# Patient Record
Sex: Male | Born: 1980 | Race: Asian | Hispanic: No | Marital: Married | Smoking: Former smoker
Health system: Southern US, Community
[De-identification: ages and names within clinical notes are randomized; demographics above are authoritative.]

## PROBLEM LIST (undated history)

## (undated) HISTORY — PX: NO PAST SURGERIES: SHX2092

---

## 2021-01-31 ENCOUNTER — Other Ambulatory Visit: Payer: Self-pay

## 2021-01-31 ENCOUNTER — Encounter: Payer: Self-pay | Admitting: Cardiology

## 2021-01-31 ENCOUNTER — Ambulatory Visit: Payer: 59 | Admitting: Cardiology

## 2021-01-31 VITALS — BP 110/80 | HR 94 | Ht 72.0 in | Wt 191.0 lb

## 2021-01-31 DIAGNOSIS — K219 Gastro-esophageal reflux disease without esophagitis: Secondary | ICD-10-CM

## 2021-01-31 DIAGNOSIS — E782 Mixed hyperlipidemia: Secondary | ICD-10-CM | POA: Insufficient documentation

## 2021-01-31 DIAGNOSIS — R079 Chest pain, unspecified: Secondary | ICD-10-CM

## 2021-01-31 HISTORY — DX: Chest pain, unspecified: R07.9

## 2021-01-31 HISTORY — DX: Mixed hyperlipidemia: E78.2

## 2021-01-31 HISTORY — DX: Gastro-esophageal reflux disease without esophagitis: K21.9

## 2021-01-31 MED ORDER — NITROGLYCERIN 0.4 MG SL SUBL: 0.4000 mg | SUBLINGUAL_TABLET | SUBLINGUAL | 6 refills | Status: DC | PRN

## 2021-01-31 NOTE — Patient Instructions (Signed)
Medication Instructions:  Your physician has recommended you make the following change in your medication:   Use nitroglycerin 1 tablet placed under the tongue at the first sign of chest pain or an angina attack. 1 tablet may be used every 5 minutes as needed, for up to 15 minutes. Do not take more than 3 tablets in 15 minutes. If pain persist call 911 or go to the nearest ED.    *If you need a refill on your cardiac medications before your next appointment, please call your pharmacy*   Lab Work: Your physician recommends that you return for lab work in: the next few days. You need to have labs done when you are fasting.  You can come Monday through Friday 8:30 am to 12:00 pm and 1:15 to 4:30. You do not need to make an appointment as the order has already been placed. The labs you are going to have done are BMET, CBC, TSH, LFT and Lipids.  If you have labs (blood work) drawn today and your tests are completely normal, you will receive your results only by: MyChart Message (if you have MyChart) OR A paper copy in the mail If you have any lab test that is abnormal or we need to change your treatment, we will call you to review the results.   Testing/Procedures:  Stress Echocardiogram Information Sheet Instructions:  This test will be done at [facility].  Nothing to eat or drink after midnight.  Dress prepared to exercise.  Please bring all current prescription medications with you to the test.  We will order CT coronary calcium score. It will cost $99.00 and is not covered by insurance.  Please call [phone] to schedule.   [facility]  [address]  [place], [place] [address]   Follow-Up: At [facility], you and your health needs are our priority.  As part of our continuing mission to provide you with exceptional heart care, we have created designated Provider Care Teams.  These Care Teams include your primary Cardiologist (physician) and Advanced  Practice Providers (APPs -  Physician Assistants and Nurse Practitioners) who all work together to provide you with the care you need, when you need it.  We recommend signing up for the patient portal called "MyChart".  Sign up information is provided on this After Visit Summary.  MyChart is used to connect with patients for Virtual Visits (Telemedicine).  Patients are able to view lab/test results, encounter notes, upcoming appointments, etc.  Non-urgent messages can be sent to your provider as well.   To learn more about what you can do with MyChart, go to [email or web address].    Your next appointment:   6 month(s)  The format for your next appointment:   In Person  Provider:   Belva Crome, MD   Other Instructions Exercise Stress Echocardiogram An exercise stress echocardiogram is a test to check how well your heart is working. This test uses sound waves (ultrasound) and a computer to make images of your heart before and after exercise. Ultrasound images that are taken before you exercise (resting echocardiogram) will show how much blood is getting to your heart muscle and how well yourheart muscle and heart valves are functioning. During the next part of this test, you will walk on a treadmill or ride a stationary bicycle to see how exercise affects your heart. While you exercise, the electrical activity of your heart will be monitored with anelectrocardiogram (ECG). Your blood pressure will also be  monitored. You may have this test if you have: Chest pain or other symptoms of a heart problem. Recently had a heart attack or heart surgery. Heart valve problems. A condition that causes narrowing of the blood vessels that supply your heart (coronary artery disease). A high risk of heart disease and are starting a new exercise program. A high risk of heart disease and need to have major surgery. Heart arrhythmia (abnormal heart rhythm) problems. Heart failure problems. Tell a health  care provider about: Any allergies you have. All medicines you are taking, including vitamins, herbs, eye drops, creams, and over-the-counter medicines. Any problems you or family members have had with anesthetic medicines. Any surgeries you have had. Any blood disorders you have. Any medical conditions you have. Whether you are pregnant or may be pregnant. What are the risks? Generally, this is a safe test. However, problems may occur, including: Chest pain. Dizziness or light-headedness. Shortness of breath. Increased or irregular heartbeat (palpitations). Nausea or vomiting. Heart attack. This is very rare. What happens before the test? Medicines Ask your health care provider about changing or stopping your regular medicines. This is especially important if you are taking diabetes medicines or blood thinners. If you use an inhaler, bring it with you to the test. General instructions Wear loose, comfortable clothing and walking shoes. Follow instructions from your health care provider about eating or drinking restrictions. You may be asked to avoid all forms of caffeine for 24 hours before your test, or as told by your health care provider. Do not use any products that contain nicotine or tobacco for 4 hours before the test, or as told by your health care provider. These products include cigarettes, chewing tobacco, and vaping devices, such as e-cigarettes. If you need help quitting, ask your health care provider. What happens during the test?  You will take off your clothes from the waist up and put on a hospital gown. Electrodes or electrocardiogram (ECG) patches may be placed on your chest. The electrodes or patches are then connected to a device that monitors your heart rate and rhythm. A blood pressure cuff will be placed on your arm. You will lie down on a table for an ultrasound exam before you exercise. A gel will be applied to your chest to help sound waves pass through your  skin. A handheld device, called a transducer, will be pressed against your chest and moved over your heart. The transducer produces sound waves that travel to your heart and bounce back (or "echo" back) to the transducer. These sound waves will be captured in real-time and changed into images of your heart that can be viewed on a video monitor. The images will be recorded on a computer and reviewed by your health care provider. Once the ultrasound is complete, you will start exercising by walking on a treadmill or pedaling a stationary bicycle. Your blood pressure and heart rhythm will be monitored while you exercise. The exercise will gradually get harder or faster. You will exercise until: Your heart reaches a target level. You are too tired to continue. You cannot continue because of chest pain, weakness, or dizziness. You will have another ultrasound exam immediately after you stop exercising. The procedure may vary among health care providers and hospitals. What can I expect after the test? After your test, it is common to have: Mild soreness. Mild fatigue. Your heart rate and blood pressure will be monitored until they return to yournormal levels. You should not have any new  symptoms after this test. Follow these instructions at home: After your stress test, you should be able to return to your normal activities and diet. Take over-the-counter and prescription medicines only as told by your health care provider. Keep all follow-up visits. This is important. It is up to you to get the results of your test. Ask your health care provider, or the department that is doing the test, when your results will be ready. Contact a health care provider if you: Feel dizzy or light-headed. Have a fast or irregular heartbeat. Have nausea or vomiting. Have a headache. Feel short of breath. Get help right away if you: Develop pain or pressure: In your chest. In your jaw or neck. Between your  shoulder blades. Radiating down your left arm. Faint. Have trouble breathing. These symptoms may represent a serious problem that is an emergency. Do not wait to see if the symptoms will go away. Get medical help right away. Call your local emergency services (911 in the [place] Do not drive yourself to the hospital. Summary An exercise stress echocardiogram is a test that uses ultrasound to check how well your heart works before and after exercise. Before the test, follow instructions from your health care provider about stopping medicines and avoiding caffeine, nicotine and tobacco, and certain foods and drinks. During the test, your blood pressure and heart rhythm will be monitored while you exercise on a treadmill or stationary bicycle. This information is not intended to replace advice given to you by your health care provider. Make sure you discuss any questions you have with your healthcare provider. Document Revised: 12/21/2019 Document Reviewed: 12/21/2019 Elsevier Patient Education  2022 [employer].

## 2021-01-31 NOTE — Progress Notes (Signed)
Cardiology Office Note:    Date:  01/31/2021   ID:  George Mccarthy, DOB [birth year], MRN [id number]  PCP:  Pcp, No  Cardiologist:  Garwin Brothers, MD   Referring MD: No ref. provider found    ASSESSMENT:    1. Chest pain of uncertain etiology   2. Gastroesophageal reflux disease, unspecified whether esophagitis present   3. Mixed dyslipidemia    PLAN:    In order of problems listed above:  Primary prevention stressed with the patient.  Importance of compliance with diet medication stressed any vocalized understanding. Chest pain: Atypical for coronary etiology.  Following recommendations were made.  Sublingual nitroglycerin prescription was sent, its protocol and 911 protocol explained and the patient vocalized understanding questions were answered to the patient's satisfaction.  I also told the patient to resume his proton pump inhibitors over-the-counter on a regular basis to see if he gets relief.  We will do a stress echo to assess the symptoms.  He is agreeable.  He knows to go to the nearest emergency room for any concerning symptoms. Mixed dyslipidemia: He brought old records done several months ago at an outside clinic with blood work.  His TSH was fine and his LDL was markedly elevated at 175.  We will repeat this blood work and advise him accordingly.  He vocalized understanding. Patient will be seen in follow-up appointment in 6 months or earlier if the patient has any concerns    Medication Adjustments/Labs and Tests Ordered: Current medicines are reviewed at length with the patient today.  Concerns regarding medicines are outlined above.  Orders Placed This Encounter  Procedures   CT CARDIAC SCORING   Basic metabolic panel   CBC with Differential/Platelet   Hepatic function panel   Lipid panel   TSH   EKG 12-Lead   ECHOCARDIOGRAM STRESS TEST   Meds ordered this encounter  Medications   nitroGLYCERIN (NITROSTAT) 0.4 MG SL tablet    Sig: Place 1 tablet (0.4 mg  total) under the tongue every 5 (five) minutes as needed.    Dispense:  25 tablet    Refill:  6     History of Present Illness:    George Mccarthy is a 40 y.o. male who is being seen today for the evaluation of chest pain. Patient is a pleasant 40 year old male.  He has past medical history of mixed dyslipidemia.  Patient mentions to me that he has been having episodes of chest tightness and not related to exertion.  He does not exercise on a regular basis no radiation to the neck or to the arms.  He walks around his apartment complex with his son without any discomfort.  No orthopnea or PND.  No radiation of the symptoms to the back.  Patient does not get the symptoms during sexual activity.  At the time of my evaluation, the patient is alert awake oriented and in no distress.  History reviewed. No pertinent past medical history.  Past Surgical History:  Procedure Laterality Date   NO PAST SURGERIES      Current Medications: Current Meds  Medication Sig   nitroGLYCERIN (NITROSTAT) 0.4 MG SL tablet Place 1 tablet (0.4 mg total) under the tongue every 5 (five) minutes as needed.     Allergies:   Patient has no known allergies.   Social History   Socioeconomic History   Marital status: Married    Spouse name: Not on file   Number of children: Not on file   Years  of education: Not on file   Highest education level: Not on file  Occupational History   Not on file  Tobacco Use   Smoking status: Former    Types: Cigarettes   Smokeless tobacco: Never  Substance and Sexual Activity   Alcohol use: Never   Drug use: Never   Sexual activity: Not on file  Other Topics Concern   Not on file  Social History Narrative   Not on file   Social Determinants of Health   Financial Resource Strain: Not on file  Food Insecurity: Not on file  Transportation Needs: Not on file  Physical Activity: Not on file  Stress: Not on file  Social Connections: Not on file     Family History: The  patient's family history includes Congenital heart disease in his sister; Heart disease in his mother.  ROS:   Please see the history of present illness.    All other systems reviewed and are negative.  EKGs/Labs/Other Studies Reviewed:    The following studies were reviewed today: I discussed my findings with the patient at length.  EKG reveals sinus rhythm and nonspecific ST-T changes   Recent Labs: No results found for requested labs within last 8760 hours.  Recent Lipid Panel No results found for: CHOL, TRIG, HDL, CHOLHDL, VLDL, LDLCALC, LDLDIRECT  Physical Exam:    VS:  BP 110/80 (BP Location: Left Arm, Patient Position: Sitting, Cuff Size: Normal)   Pulse 94   Ht 6' (1.829 m)   Wt 191 lb (86.6 kg)   SpO2 97%   BMI 25.90 kg/m     Wt Readings from Last 3 Encounters:  01/31/21 191 lb (86.6 kg)     GEN: Patient is in no acute distress HEENT: Normal NECK: No JVD; No carotid bruits LYMPHATICS: No lymphadenopathy CARDIAC: S1 S2 regular, 2/6 systolic murmur at the apex. RESPIRATORY:  Clear to auscultation without rales, wheezing or rhonchi  ABDOMEN: Soft, non-tender, non-distended MUSCULOSKELETAL:  No edema; No deformity  SKIN: Warm and dry NEUROLOGIC:  Alert and oriented x 3 PSYCHIATRIC:  Normal affect    Signed, Garwin Brothers, MD  01/31/2021 4:47 PM     [facility]

## 2021-02-07 DIAGNOSIS — R079 Chest pain, unspecified: Secondary | ICD-10-CM | POA: Diagnosis not present

## 2021-02-08 ENCOUNTER — Telehealth: Payer: Self-pay

## 2021-02-08 ENCOUNTER — Other Ambulatory Visit: Payer: Self-pay | Admitting: Cardiology

## 2021-02-08 DIAGNOSIS — E782 Mixed hyperlipidemia: Secondary | ICD-10-CM

## 2021-02-08 LAB — LIPID PANEL
Chol/HDL Ratio: 6.2 ratio — ABNORMAL HIGH (ref 0.0–5.0)
Cholesterol, Total: 253 mg/dL — ABNORMAL HIGH (ref 100–199)
HDL: 41 mg/dL (ref >39)
LDL Chol Calc (NIH): 182 mg/dL — ABNORMAL HIGH (ref 0–99)
Triglycerides: 159 mg/dL — ABNORMAL HIGH (ref 0–149)
VLDL Cholesterol Cal: 30 mg/dL (ref 5–40)

## 2021-02-08 LAB — HEPATIC FUNCTION PANEL
ALT: 35 IU/L (ref 0–44)
AST: 26 IU/L (ref 0–40)
Albumin: 4.5 g/dL (ref 4.0–5.0)
Alkaline Phosphatase: 75 IU/L (ref 44–121)
Bilirubin Total: 0.4 mg/dL (ref 0.0–1.2)
Bilirubin, Direct: 0.11 mg/dL (ref 0.00–0.40)
Total Protein: 6.9 g/dL (ref 6.0–8.5)

## 2021-02-08 LAB — CBC WITH DIFFERENTIAL/PLATELET
Basophils Absolute: 0 10*3/uL (ref 0.0–0.2)
Basos: 0 %
EOS (ABSOLUTE): 0.1 10*3/uL (ref 0.0–0.4)
Eos: 1 %
Hematocrit: 45 % (ref 37.5–51.0)
Hemoglobin: 14.8 g/dL (ref 13.0–17.7)
Immature Grans (Abs): 0 10*3/uL (ref 0.0–0.1)
Immature Granulocytes: 0 %
Lymphocytes Absolute: 1.1 10*3/uL (ref 0.7–3.1)
Lymphs: 15 %
MCH: 28.7 pg (ref 26.6–33.0)
MCHC: 32.9 g/dL (ref 31.5–35.7)
MCV: 87 fL (ref 79–97)
Monocytes Absolute: 0.4 10*3/uL (ref 0.1–0.9)
Monocytes: 5 %
Neutrophils Absolute: 6 10*3/uL (ref 1.4–7.0)
Neutrophils: 79 %
Platelets: 189 10*3/uL (ref 150–450)
RBC: 5.16 x10E6/uL (ref 4.14–5.80)
RDW: 13.5 % (ref 11.6–15.4)
WBC: 7.7 10*3/uL (ref 3.4–10.8)

## 2021-02-08 LAB — BASIC METABOLIC PANEL
BUN/Creatinine Ratio: 9 (ref 9–20)
BUN: 8 mg/dL (ref 6–24)
CO2: 22 mmol/L (ref 20–29)
Calcium: 8.9 mg/dL (ref 8.7–10.2)
Chloride: 102 mmol/L (ref 96–106)
Creatinine, Ser: 0.86 mg/dL (ref 0.76–1.27)
Glucose: 101 mg/dL — ABNORMAL HIGH (ref 70–99)
Potassium: 4.1 mmol/L (ref 3.5–5.2)
Sodium: 139 mmol/L (ref 134–144)
eGFR: 112 mL/min/{1.73_m2} (ref >59)

## 2021-02-08 LAB — TSH: TSH: 1.17 u[IU]/mL (ref 0.450–4.500)

## 2021-02-08 MED ORDER — ROSUVASTATIN CALCIUM 10 MG PO TABS: 10.0000 mg | ORAL_TABLET | Freq: Every day | ORAL | 3 refills | Status: DC

## 2021-02-08 NOTE — Telephone Encounter (Signed)
-----   Message from Garwin Brothers, MD sent at 02/08/2021 10:58 AM EDT ----- I spoke to him of diet and dietician. Crestor 10 and ll in 6wks. Cc pcp Garwin Brothers, MD 02/08/2021 10:58 AM

## 2021-02-08 NOTE — Telephone Encounter (Signed)
Spoke with patient regarding results and recommendation.  Patient verbalizes understanding and is agreeable to plan of care. Advised patient to call back with any issues or concerns.  

## 2021-02-09 ENCOUNTER — Telehealth: Payer: Self-pay | Admitting: Cardiology

## 2021-02-09 NOTE — Telephone Encounter (Signed)
George Mccarthy  from CT called in and stated the CT that is sch for Next week on 10/7 Does not have Diagnosis  code on it .  She would like to know if someone could add it on the order or give her a call at:  [phone]

## 2021-02-09 NOTE — Telephone Encounter (Signed)
Spoke with Babs Sciara, she advised that there was no associated dx. Dx has been associated

## 2021-02-16 ENCOUNTER — Other Ambulatory Visit: Payer: Self-pay

## 2021-02-16 ENCOUNTER — Ambulatory Visit (INDEPENDENT_AMBULATORY_CARE_PROVIDER_SITE_OTHER)
Admission: RE | Admit: 2021-02-16 | Discharge: 2021-02-16 | Disposition: A | Discharge status: Home / Self Care | Payer: Self-pay | Source: Ambulatory Visit | Attending: Cardiology | Admitting: Cardiology

## 2021-02-16 DIAGNOSIS — E782 Mixed hyperlipidemia: Secondary | ICD-10-CM

## 2021-02-16 DIAGNOSIS — R079 Chest pain, unspecified: Secondary | ICD-10-CM

## 2021-07-13 ENCOUNTER — Ambulatory Visit: Payer: 59 | Admitting: Cardiology

## 2021-08-21 ENCOUNTER — Other Ambulatory Visit: Payer: Self-pay

## 2021-08-22 ENCOUNTER — Other Ambulatory Visit: Payer: Self-pay

## 2021-08-22 ENCOUNTER — Telehealth: Payer: Self-pay | Admitting: Cardiology

## 2021-08-22 DIAGNOSIS — E782 Mixed hyperlipidemia: Secondary | ICD-10-CM

## 2021-08-22 NOTE — Telephone Encounter (Signed)
New Message: ? ? ? ? ?Patient is at the lab at the [facility] location. He needs an order asap please., ?

## 2021-08-22 NOTE — Telephone Encounter (Signed)
Pt advised that the lab has been released. ?

## 2021-08-23 ENCOUNTER — Ambulatory Visit: Payer: 59 | Admitting: Cardiology

## 2021-08-23 LAB — LIPID PANEL
Chol/HDL Ratio: 6.2 ratio — ABNORMAL HIGH (ref 0.0–5.0)
Cholesterol, Total: 273 mg/dL — ABNORMAL HIGH (ref 100–199)
HDL: 44 mg/dL (ref >39)
LDL Chol Calc (NIH): 176 mg/dL — ABNORMAL HIGH (ref 0–99)
Triglycerides: 278 mg/dL — ABNORMAL HIGH (ref 0–149)
VLDL Cholesterol Cal: 53 mg/dL — ABNORMAL HIGH (ref 5–40)

## 2021-08-23 MED ORDER — ROSUVASTATIN CALCIUM 10 MG PO TABS: 10.0000 mg | ORAL_TABLET | Freq: Every day | ORAL | 3 refills | Status: DC

## 2021-08-24 ENCOUNTER — Ambulatory Visit: Payer: 59 | Admitting: Cardiology

## 2021-10-17 ENCOUNTER — Other Ambulatory Visit: Payer: Self-pay

## 2021-10-17 DIAGNOSIS — E782 Mixed hyperlipidemia: Secondary | ICD-10-CM

## 2021-10-18 LAB — HEPATIC FUNCTION PANEL
ALT: 37 IU/L (ref 0–44)
AST: 24 IU/L (ref 0–40)
Albumin: 4.4 g/dL (ref 4.0–5.0)
Alkaline Phosphatase: 66 IU/L (ref 44–121)
Bilirubin Total: 0.3 mg/dL (ref 0.0–1.2)
Bilirubin, Direct: 0.11 mg/dL (ref 0.00–0.40)
Total Protein: 6.7 g/dL (ref 6.0–8.5)

## 2021-10-18 LAB — LIPID PANEL
Chol/HDL Ratio: 3.2 ratio (ref 0.0–5.0)
Cholesterol, Total: 135 mg/dL (ref 100–199)
HDL: 42 mg/dL (ref >39)
LDL Chol Calc (NIH): 56 mg/dL (ref 0–99)
Triglycerides: 231 mg/dL — ABNORMAL HIGH (ref 0–149)
VLDL Cholesterol Cal: 37 mg/dL (ref 5–40)

## 2021-10-19 ENCOUNTER — Ambulatory Visit: Payer: 59 | Admitting: Cardiology

## 2021-10-19 ENCOUNTER — Encounter: Payer: Self-pay | Admitting: Cardiology

## 2021-10-19 VITALS — BP 108/72 | HR 74 | Ht 72.0 in | Wt 191.8 lb

## 2021-10-19 DIAGNOSIS — R931 Abnormal findings on diagnostic imaging of heart and coronary circulation: Secondary | ICD-10-CM

## 2021-10-19 DIAGNOSIS — E782 Mixed hyperlipidemia: Secondary | ICD-10-CM | POA: Diagnosis not present

## 2021-10-19 DIAGNOSIS — R5383 Other fatigue: Secondary | ICD-10-CM | POA: Diagnosis not present

## 2021-10-19 DIAGNOSIS — R079 Chest pain, unspecified: Secondary | ICD-10-CM | POA: Diagnosis not present

## 2021-10-19 HISTORY — DX: Abnormal findings on diagnostic imaging of heart and coronary circulation: R93.1

## 2021-10-19 NOTE — Patient Instructions (Addendum)
Medication Instructions:  Your physician recommends that you continue on your current medications as directed. Please refer to the Current Medication list given to you today.  *If you need a refill on your cardiac medications before your next appointment, please call your pharmacy*   Lab Work: Your physician recommends that you return for lab work in: 4 months You need to have labs done when you are fasting.  You can come Monday through Friday 8:30 am to 12:00 pm and 1:15 to 4:30. You do not need to make an appointment as the order has already been placed. The labs you are going to have done are BMET, CBC, TSH, LFT and Lipids, Vitamin D and HgbA1C If you have labs (blood work) drawn today and your tests are completely normal, you will receive your results only by: MyChart Message (if you have MyChart) OR A paper copy in the mail If you have any lab test that is abnormal or we need to change your treatment, we will call you to review the results.   Testing/Procedures: Your physician has requested that you have an echocardiogram. Echocardiography is a painless test that uses sound waves to create images of your heart. It provides your doctor with information about the size and shape of your heart and how well your heart's chambers and valves are working. This procedure takes approximately one hour. There are no restrictions for this procedure.   Your physician has requested that you have a renal artery duplex. During this test, an ultrasound is used to evaluate blood flow to the kidneys. Allow one hour for this exam. Do not eat after midnight the day before and avoid carbonated beverages. Take your medications as you usually do.   Follow-Up: At [facility], you and your health needs are our priority.  As part of our continuing mission to provide you with exceptional heart care, we have created designated Provider Care Teams.  These Care Teams include your primary Cardiologist (physician) and  Advanced Practice Providers (APPs -  Physician Assistants and Nurse Practitioners) who all work together to provide you with the care you need, when you need it.  We recommend signing up for the patient portal called "MyChart".  Sign up information is provided on this After Visit Summary.  MyChart is used to connect with patients for Virtual Visits (Telemedicine).  Patients are able to view lab/test results, encounter notes, upcoming appointments, etc.  Non-urgent messages can be sent to your provider as well.   To learn more about what you can do with MyChart, go to [email or web address].    Your next appointment:   12 month(s)  The format for your next appointment:   In Person  Provider:   Belva Crome, MD    Other Instructions NA

## 2021-10-19 NOTE — Progress Notes (Signed)
Cardiology Office Note:    Date:  10/19/2021   ID:  George Mccarthy, DOB 1980/11/09, MRN [id number]  PCP:  Pcp, No  Cardiologist:  Garwin Brothers, MD   Referring MD: No ref. provider found    ASSESSMENT:    1. Chest pain of uncertain etiology   2. Elevated coronary artery calcium score   3. Mixed dyslipidemia    PLAN:    In order of problems listed above:  Mildly elevated calcium score: Secondary prevention stressed with the patient.  Importance of compliance with diet medication stressed any vocalized understanding.  He was advised to walk at least half an hour a day 5 days a week and he promises to do so. Mixed dyslipidemia: On statin therapy.  LDL is at goal.  Triglycerides are still elevated.  Diet emphasized at extensive length and he promises to do better.  He will have complete blood work in 4 months for follow-up.  He will be seen in follow-up appointment on an annual basis.  He had multiple questions which were answered to his satisfaction.   Medication Adjustments/Labs and Tests Ordered: Current medicines are reviewed at length with the patient today.  Concerns regarding medicines are outlined above.  No orders of the defined types were placed in this encounter.  No orders of the defined types were placed in this encounter.    No chief complaint on file.    History of Present Illness:    George Mccarthy is a 41 y.o. male.  Patient has past medical history of elevated calcium score and mixed dyslipidemia.  He denies any problems at this time and takes care of activities of daily living.  No chest pain orthopnea or PND.  At the time of my evaluation, the patient is alert awake oriented and in no distress.  Past Medical History:  Diagnosis Date   Chest pain of uncertain etiology 01/31/2021   GERD (gastroesophageal reflux disease) 01/31/2021   Mixed dyslipidemia 01/31/2021    Past Surgical History:  Procedure Laterality Date   NO PAST SURGERIES      Current  Medications: Current Meds  Medication Sig   nitroGLYCERIN (NITROSTAT) 0.4 MG SL tablet Place 0.4 mg under the tongue every 5 (five) minutes as needed for chest pain.   rosuvastatin (CRESTOR) 10 MG tablet Take 1 tablet (10 mg total) by mouth daily.     Allergies:   Patient has no known allergies.   Social History   Socioeconomic History   Marital status: Married    Spouse name: Not on file   Number of children: Not on file   Years of education: Not on file   Highest education level: Not on file  Occupational History   Not on file  Tobacco Use   Smoking status: Former    Types: Cigarettes    Passive exposure: Past   Smokeless tobacco: Never  Vaping Use   Vaping Use: Never used  Substance and Sexual Activity   Alcohol use: Never   Drug use: Never   Sexual activity: Not on file  Other Topics Concern   Not on file  Social History Narrative   Not on file   Social Determinants of Health   Financial Resource Strain: Not on file  Food Insecurity: Not on file  Transportation Needs: Not on file  Physical Activity: Not on file  Stress: Not on file  Social Connections: Not on file     Family History: The patient's family history includes Congenital heart disease  in his sister; Heart disease in his mother.  ROS:   Please see the history of present illness.    All other systems reviewed and are negative.  EKGs/Labs/Other Studies Reviewed:    The following studies were reviewed today: I discussed my findings with the patient at length   Recent Labs: 02/07/2021: BUN 8; Creatinine, Ser 0.86; Hemoglobin 14.8; Platelets 189; Potassium 4.1; Sodium 139; TSH 1.170 10/17/2021: ALT 37  Recent Lipid Panel    Component Value Date/Time   CHOL 135 10/17/2021 0837   TRIG 231 (H) 10/17/2021 0837   HDL 42 10/17/2021 0837   CHOLHDL 3.2 10/17/2021 0837   LDLCALC 56 10/17/2021 0837    Physical Exam:    VS:  BP 108/72 (BP Location: Left Arm, Patient Position: Sitting)   Pulse 74    Ht 6' (1.829 m)   Wt 191 lb 12.8 oz (87 kg)   SpO2 97%   BMI 26.01 kg/m     Wt Readings from Last 3 Encounters:  10/19/21 191 lb 12.8 oz (87 kg)  01/31/21 191 lb (86.6 kg)     GEN: Patient is in no acute distress HEENT: Normal NECK: No JVD; No carotid bruits LYMPHATICS: No lymphadenopathy CARDIAC: Hear sounds regular, 2/6 systolic murmur at the apex. RESPIRATORY:  Clear to auscultation without rales, wheezing or rhonchi  ABDOMEN: Soft, non-tender, non-distended MUSCULOSKELETAL:  No edema; No deformity  SKIN: Warm and dry NEUROLOGIC:  Alert and oriented x 3 PSYCHIATRIC:  Normal affect   Signed, Garwin Brothers, MD  10/19/2021 10:13 AM    [facility]

## 2022-09-05 IMAGING — CT CT CARDIAC CORONARY ARTERY CALCIUM SCORE
3 series · 14 of 20 positions shown, 16 images · non-contrast
Comparison: None.
COMPARISON: None.

Addendum:
EXAM:
OVER-READ INTERPRETATION  CT CHEST

The following report is an over-read performed by radiologist Dr.
Nana Qwasi Khalifa Jnr [REDACTED] on 02/16/2021. This
over-read does not include interpretation of cardiac or coronary
anatomy or pathology. The coronary calcium score interpretation by
the cardiologist is attached.
CLINICAL DATA: Risk stratification: 39 Year-old [place] Asian male
Coronary Calcium Score
TECHNIQUE: The patient was scanned on a Siemens Force scanner. Axial
non-contrast 3 mm slices were carried out through the heart. The
data set was analyzed on a dedicated work station and scored using
the Agatson method.

[Series 2: cascseq 2.0 sa36 (id) (id) · axial · 0.39mm/px · z∈[-207,-127]mm · 4 of 68 slices shown]
[im 14/68  vessel]
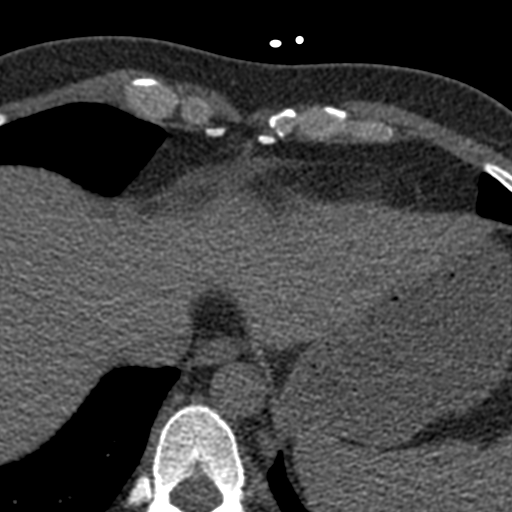
[im 27/68  vessel]
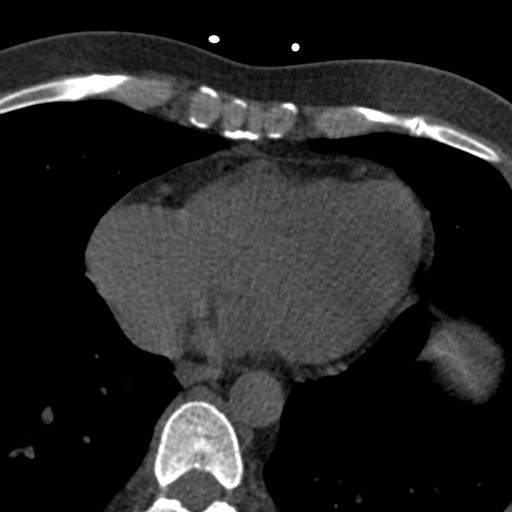
[im 41/68  vessel]
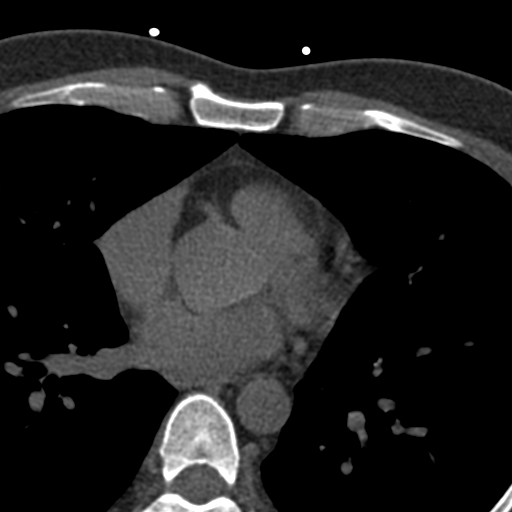
[im 54/68  vessel]
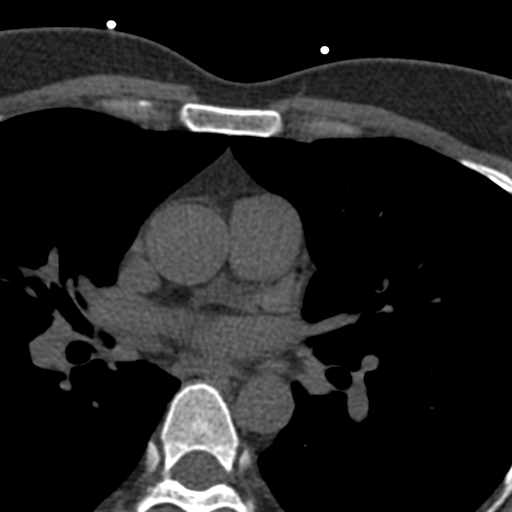

[Series 3: cascseq 2.0 bf37 st · axial · 0.70mm/px · z∈[-211,-123]mm · 5 of 68 slices shown, 7 images]
[im 12/68  vessel]
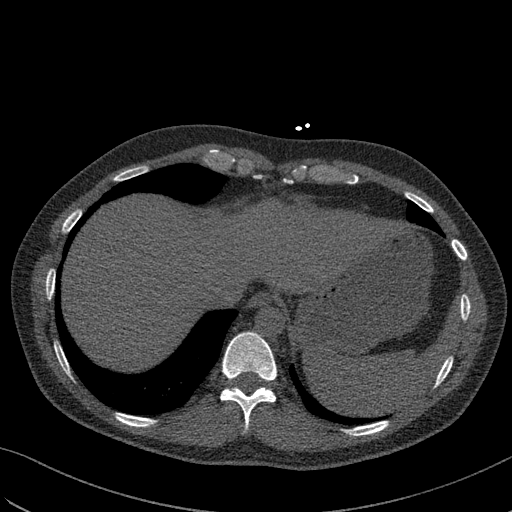
[im 12/68  lung]
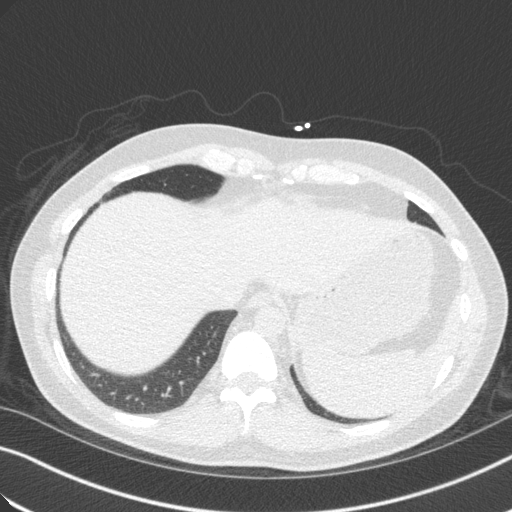
[im 23/68  vessel]
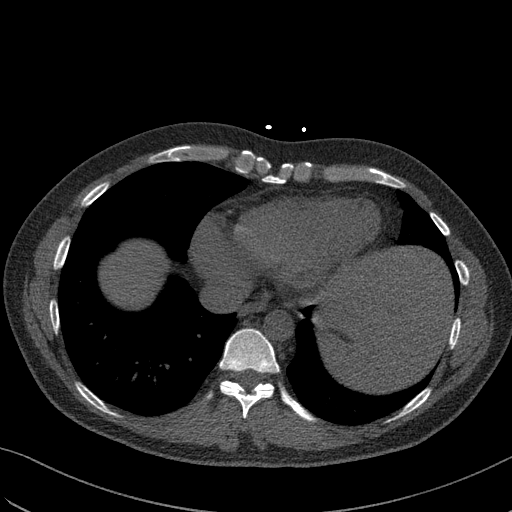
[im 34/68  vessel]
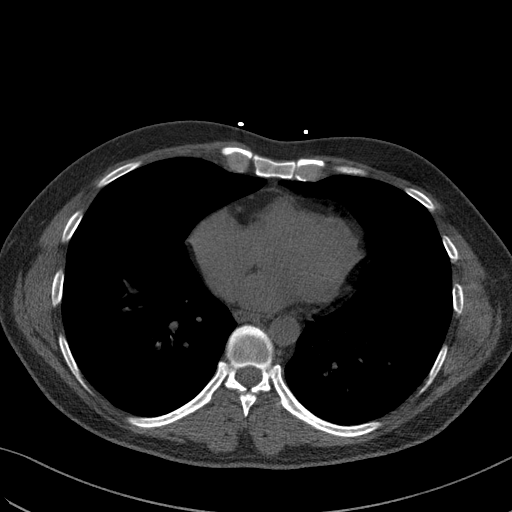
[im 45/68  vessel]
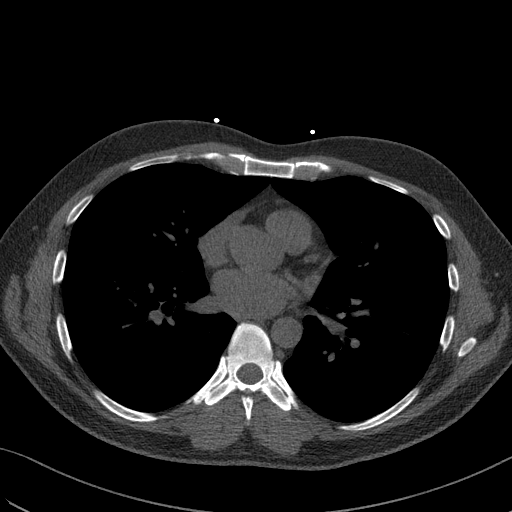
[im 56/68  vessel]
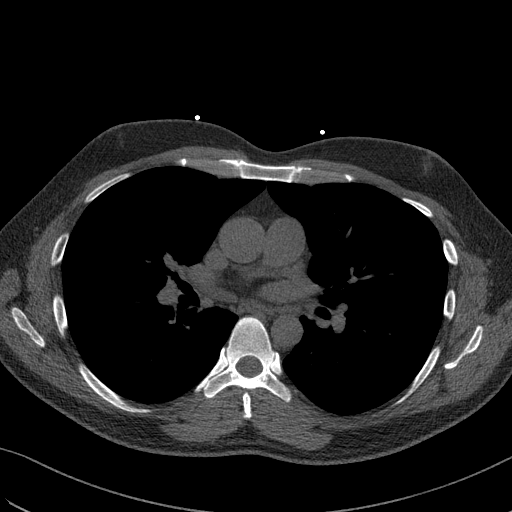
[im 56/68  lung]
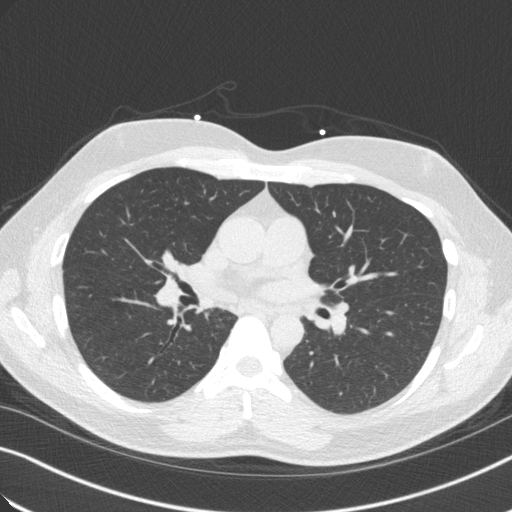

[Series 4: cascseq 2.0 br59 lung · axial · 0.70mm/px · z∈[-211,-123]mm · 5 of 68 slices shown]
[im 12/68  lung]
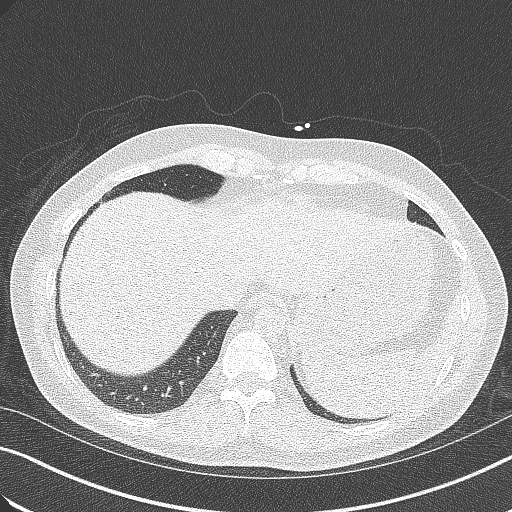
[im 23/68  lung]
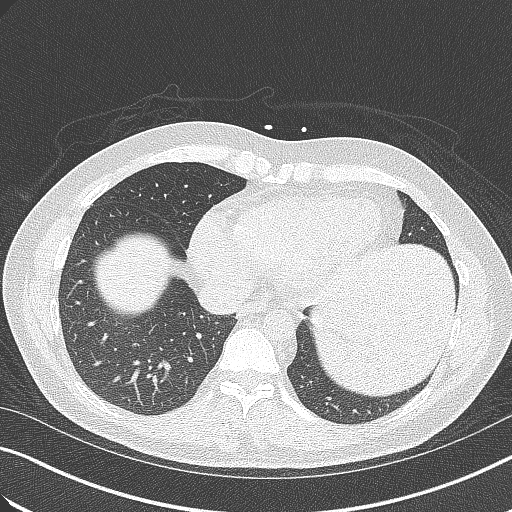
[im 34/68  lung]
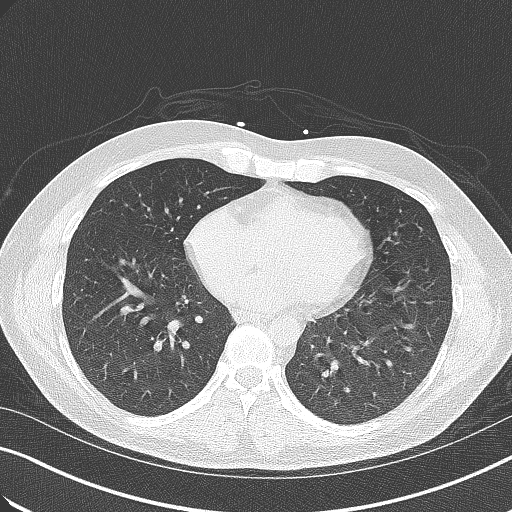
[im 45/68  lung]
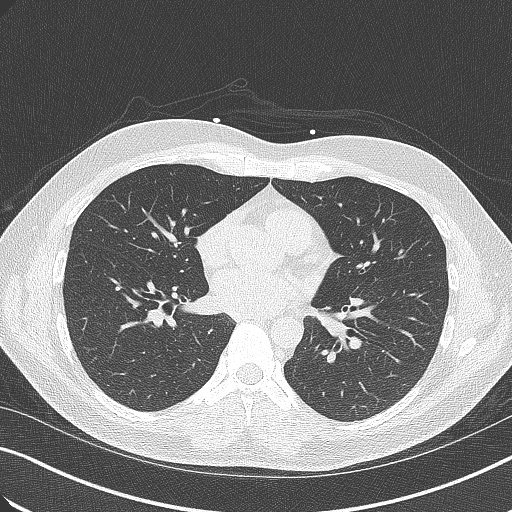
[im 56/68  lung]
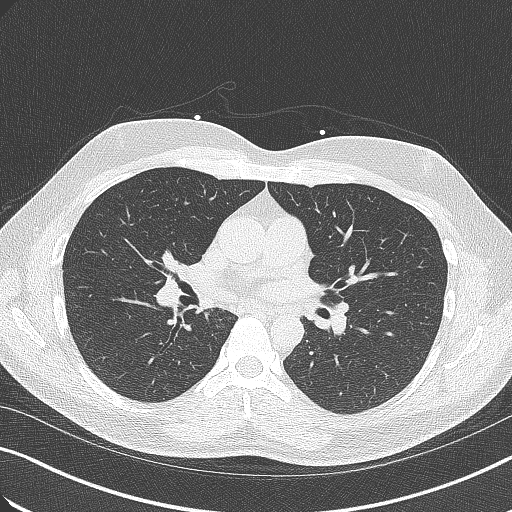

[14 of 20 positions shown; findings below may reference images not displayed]

FINDINGS: Within the visualized portions of the thorax there are no suspicious
appearing pulmonary nodules or masses, there is no acute
consolidative airspace disease, no pleural effusions, no
pneumothorax and no lymphadenopathy. Visualized portions of the
upper abdomen are unremarkable. There are no aggressive appearing
lytic or blastic lesions noted in the visualized portions of the
skeleton.
IMPRESSION: 1. No significant incidental noncardiac findings are noted.
FINDINGS: Non-cardiac: See separate report from [REDACTED].

Ascending Aorta: Normal caliber.

Pericardium: Normal.

Coronary arteries: Normal origins.

Coronary Calcium Score:

Left main: 0

Left anterior descending artery: 0

Left circumflex artery: 0

Right coronary artery: 1

Total: 1
IMPRESSION: 1. Coronary calcium score of 1. Elevated when match to age and
gender.

RECOMMENDATIONS:



If CAC = 0, it is reasonable to withhold statin therapy and reassess
in 5 to 10 years, as long as higher risk conditions are absent
(diabetes mellitus, family history of premature CHD in first degree
relatives (males <55 years; females <65 years), cigarette smoking,
LDL >=190 mg/dL or other independent risk factors).

If CAC is 1 to 99, it is reasonable to initiate statin therapy for
patients >=55 years of age.

If CAC is >=100 or >=75th percentile, it is reasonable to initiate
statin therapy at any age.

Cardiology referral should be considered for patients with CAC
scores =400 or >=75th percentile.

*2250 AHA/ACC/AACVPR/AAPA/ABC/JOSHJAX/LANZA/MARCY/Bhebhe/BRENNAN/POYAZ/BAZONA BERNADIN
Guideline on the Management of Blood Cholesterol: A Report of the
American College of Cardiology/American Heart Association Task Force
on Clinical Practice Guidelines. J Am Coll Cardiol.
8285;73(24):1358-1831.

*** End of Addendum ***
EXAM:
OVER-READ INTERPRETATION  CT CHEST

The following report is an over-read performed by radiologist Dr.
Nana Qwasi Khalifa Jnr [REDACTED] on 02/16/2021. This
over-read does not include interpretation of cardiac or coronary
anatomy or pathology. The coronary calcium score interpretation by
the cardiologist is attached.
FINDINGS: Within the visualized portions of the thorax there are no suspicious
appearing pulmonary nodules or masses, there is no acute
consolidative airspace disease, no pleural effusions, no
pneumothorax and no lymphadenopathy. Visualized portions of the
upper abdomen are unremarkable. There are no aggressive appearing
lytic or blastic lesions noted in the visualized portions of the
skeleton.
IMPRESSION: 1. No significant incidental noncardiac findings are noted.

## 2023-01-28 ENCOUNTER — Other Ambulatory Visit: Payer: Self-pay | Admitting: Cardiology

## 2023-02-04 ENCOUNTER — Telehealth: Payer: Self-pay | Admitting: Cardiology

## 2023-02-04 DIAGNOSIS — R079 Chest pain, unspecified: Secondary | ICD-10-CM

## 2023-02-04 DIAGNOSIS — E782 Mixed hyperlipidemia: Secondary | ICD-10-CM

## 2023-02-04 DIAGNOSIS — Z131 Encounter for screening for diabetes mellitus: Secondary | ICD-10-CM

## 2023-02-04 DIAGNOSIS — R5383 Other fatigue: Secondary | ICD-10-CM

## 2023-02-04 DIAGNOSIS — R931 Abnormal findings on diagnostic imaging of heart and coronary circulation: Secondary | ICD-10-CM

## 2023-02-04 NOTE — Telephone Encounter (Signed)
Patient has an appt on Friday in [place] with [facility]/ would like to know if he can go ahead and have labs done prior to appt  Please call [phone]  Thank you

## 2023-02-05 NOTE — Telephone Encounter (Signed)
Pt aware that the orders have been placed.

## 2023-02-06 LAB — COMPREHENSIVE METABOLIC PANEL
ALT: 37 IU/L (ref 0–44)
AST: 35 IU/L (ref 0–40)
Albumin: 4.5 g/dL (ref 4.1–5.1)
Alkaline Phosphatase: 64 IU/L (ref 44–121)
BUN/Creatinine Ratio: 10 (ref 9–20)
BUN: 7 mg/dL (ref 6–24)
Bilirubin Total: 0.4 mg/dL (ref 0.0–1.2)
CO2: 22 mmol/L (ref 20–29)
Calcium: 9.4 mg/dL (ref 8.7–10.2)
Chloride: 105 mmol/L (ref 96–106)
Creatinine, Ser: 0.73 mg/dL — ABNORMAL LOW (ref 0.76–1.27)
Globulin, Total: 2.5 g/dL (ref 1.5–4.5)
Glucose: 109 mg/dL — ABNORMAL HIGH (ref 70–99)
Potassium: 4.9 mmol/L (ref 3.5–5.2)
Sodium: 143 mmol/L (ref 134–144)
Total Protein: 7 g/dL (ref 6.0–8.5)
eGFR: 116 mL/min/{1.73_m2} (ref >59)

## 2023-02-06 LAB — VITAMIN D 25 HYDROXY (VIT D DEFICIENCY, FRACTURES): Vit D, 25-Hydroxy: 37.4 ng/mL (ref 30.0–100.0)

## 2023-02-06 LAB — CBC
Hematocrit: 46.6 % (ref 37.5–51.0)
Hemoglobin: 14.8 g/dL (ref 13.0–17.7)
MCH: 29.3 pg (ref 26.6–33.0)
MCHC: 31.8 g/dL (ref 31.5–35.7)
MCV: 92 fL (ref 79–97)
Platelets: 116 10*3/uL — ABNORMAL LOW (ref 150–450)
RBC: 5.05 x10E6/uL (ref 4.14–5.80)
RDW: 13.8 % (ref 11.6–15.4)
WBC: 4 10*3/uL (ref 3.4–10.8)

## 2023-02-06 LAB — LIPID PANEL
Chol/HDL Ratio: 3.2 ratio (ref 0.0–5.0)
Cholesterol, Total: 166 mg/dL (ref 100–199)
HDL: 52 mg/dL (ref >39)
LDL Chol Calc (NIH): 81 mg/dL (ref 0–99)
Triglycerides: 198 mg/dL — ABNORMAL HIGH (ref 0–149)
VLDL Cholesterol Cal: 33 mg/dL (ref 5–40)

## 2023-02-06 LAB — TSH: TSH: 1.44 u[IU]/mL (ref 0.450–4.500)

## 2023-02-06 LAB — HEMOGLOBIN A1C
Est. average glucose Bld gHb Est-mCnc: 120 mg/dL
Hgb A1c MFr Bld: 5.8 % — ABNORMAL HIGH (ref 4.8–5.6)

## 2023-02-07 ENCOUNTER — Encounter: Payer: Self-pay | Admitting: Cardiology

## 2023-02-07 ENCOUNTER — Ambulatory Visit: Payer: 59 | Attending: Cardiology | Admitting: Cardiology

## 2023-02-07 VITALS — BP 106/64 | HR 93 | Ht 72.0 in | Wt 181.0 lb

## 2023-02-07 DIAGNOSIS — R931 Abnormal findings on diagnostic imaging of heart and coronary circulation: Secondary | ICD-10-CM | POA: Diagnosis not present

## 2023-02-07 DIAGNOSIS — E782 Mixed hyperlipidemia: Secondary | ICD-10-CM

## 2023-02-07 MED ORDER — ROSUVASTATIN CALCIUM 10 MG PO TABS: 10.0000 mg | ORAL_TABLET | Freq: Every day | ORAL | 3 refills | Status: DC

## 2023-02-07 NOTE — Progress Notes (Signed)
Cardiology Office Note:    Date:  02/07/2023   ID:  [name], DOB Aug 15, 1980, MRN [id number]  PCP:  Pcp, No  Cardiologist:  Garwin Brothers, MD   Referring MD: No ref. provider found    ASSESSMENT:    1. Mixed dyslipidemia   2. Elevated coronary artery calcium score    PLAN:    In order of problems listed above:  Elevated calcium score: Secondary prevention stressed to the patient.  Importance of compliance with diet medication stressed and he vocalized understanding.  He was advised to walk at least half an hour a day 5 days a week and he promises to do so. Mixed dyslipidemia: Lipids reviewed.  Triglycerides are elevated and diet emphasized.  Low carb diet was discussed and patient promises to do better. Patient will be seen in follow-up appointment in 12 months or earlier if the patient has any concerns.    Medication Adjustments/Labs and Tests Ordered: Current medicines are reviewed at length with the patient today.  Concerns regarding medicines are outlined above.  Orders Placed This Encounter  Procedures   EKG 12-Lead   Meds ordered this encounter  Medications   rosuvastatin (CRESTOR) 10 MG tablet    Sig: Take 1 tablet (10 mg total) by mouth daily.    Dispense:  90 tablet    Refill:  3     No chief complaint on file.    History of Present Illness:    George Mccarthy is a 42 y.o. male.  Patient has mildly elevated calcium score, mixed dyslipidemia.  He denies any problems at this time and takes care of activities of daily living.  No chest pain orthopnea or PND.  He is exercises now on a regular basis and does yoga also.  At the time of my evaluation, the patient is alert awake oriented and in no distress.  Past Medical History:  Diagnosis Date   Chest pain of uncertain etiology 01/31/2021   Elevated coronary artery calcium score 10/19/2021   GERD (gastroesophageal reflux disease) 01/31/2021   Mixed dyslipidemia 01/31/2021    Past Surgical History:   Procedure Laterality Date   NO PAST SURGERIES      Current Medications: Current Meds  Medication Sig   nitroGLYCERIN (NITROSTAT) 0.4 MG SL tablet Place 0.4 mg under the tongue every 5 (five) minutes as needed for chest pain.   [DISCONTINUED] rosuvastatin (CRESTOR) 10 MG tablet TAKE ONE TABLET BY MOUTH ONE TIME DAILY     Allergies:   Patient has no known allergies.   Social History   Socioeconomic History   Marital status: Married    Spouse name: Not on file   Number of children: Not on file   Years of education: Not on file   Highest education level: Not on file  Occupational History   Not on file  Tobacco Use   Smoking status: Former    Types: Cigarettes    Passive exposure: Past   Smokeless tobacco: Never  Vaping Use   Vaping status: Never Used  Substance and Sexual Activity   Alcohol use: Never   Drug use: Never   Sexual activity: Not on file  Other Topics Concern   Not on file  Social History Narrative   Not on file   Social Determinants of Health   Financial Resource Strain: Not on file  Food Insecurity: Not on file  Transportation Needs: Not on file  Physical Activity: Not on file  Stress: Not on file  Social  Connections: Unknown (09/25/2021)   Received from [facility], [facility]   Social Network    Social Network: Not on file     Family History: The patient's family history includes Congenital heart disease in his sister; Heart disease in his mother.  ROS:   Please see the history of present illness.    All other systems reviewed and are negative.  EKGs/Labs/Other Studies Reviewed:    The following studies were reviewed today: I discussed my findings with the patient at length   Recent Labs: 02/05/2023: ALT 37; BUN 7; Creatinine, Ser 0.73; Hemoglobin 14.8; Platelets 116; Potassium 4.9; Sodium 143; TSH 1.440  Recent Lipid Panel    Component Value Date/Time   CHOL 166 02/05/2023 1039   TRIG 198 (H) 02/05/2023 1039   HDL 52 02/05/2023  1039   CHOLHDL 3.2 02/05/2023 1039   LDLCALC 81 02/05/2023 1039    Physical Exam:    VS:  BP 106/64   Pulse 93   Ht 6' (1.829 m)   Wt 181 lb (82.1 kg)   SpO2 97%   BMI 24.55 kg/m     Wt Readings from Last 3 Encounters:  02/07/23 181 lb (82.1 kg)  10/19/21 191 lb 12.8 oz (87 kg)  01/31/21 191 lb (86.6 kg)     GEN: Patient is in no acute distress HEENT: Normal NECK: No JVD; No carotid bruits LYMPHATICS: No lymphadenopathy CARDIAC: Hear sounds regular, 2/6 systolic murmur at the apex. RESPIRATORY:  Clear to auscultation without rales, wheezing or rhonchi  ABDOMEN: Soft, non-tender, non-distended MUSCULOSKELETAL:  No edema; No deformity  SKIN: Warm and dry NEUROLOGIC:  Alert and oriented x 3 PSYCHIATRIC:  Normal affect   Signed, Garwin Brothers, MD  02/07/2023 4:03 PM    [facility]

## 2023-02-07 NOTE — Patient Instructions (Signed)

## 2023-06-27 ENCOUNTER — Telehealth: Payer: Self-pay | Admitting: Cardiology

## 2023-06-27 NOTE — Telephone Encounter (Signed)
Pt is calling to check on Insurance Forms he dropped off

## 2023-06-27 NOTE — Telephone Encounter (Signed)
Pt is having form faxed to our office to be completed.

## 2024-04-07 ENCOUNTER — Other Ambulatory Visit: Payer: Self-pay

## 2024-04-07 MED ORDER — ROSUVASTATIN CALCIUM 20 MG PO TABS: 20.0000 mg | ORAL_TABLET | Freq: Every day | ORAL | 3 refills | Status: AC

## 2024-04-07 NOTE — Telephone Encounter (Signed)
 Refill sent per Dr. Posey order
